# Patient Record
Sex: Female | Born: 1985 | Hispanic: Refuse to answer | Smoking: Current every day smoker
Health system: Southern US, Community
[De-identification: ages and names within clinical notes are randomized; demographics above are authoritative.]

## PROBLEM LIST (undated history)

## (undated) HISTORY — PX: WRIST SURGERY: SHX841

---

## 2010-04-29 ENCOUNTER — Emergency Department (HOSPITAL_COMMUNITY): Admission: EM | Admit: 2010-04-29 | Discharge: 2010-04-30 | Payer: Self-pay | Admitting: Emergency Medicine

## 2011-02-04 LAB — PREGNANCY, URINE: Preg Test, Ur: NEGATIVE

## 2016-04-16 ENCOUNTER — Emergency Department (HOSPITAL_COMMUNITY): Payer: No Typology Code available for payment source

## 2016-04-16 ENCOUNTER — Encounter (HOSPITAL_COMMUNITY): Payer: Self-pay | Admitting: Emergency Medicine

## 2016-04-16 ENCOUNTER — Emergency Department (HOSPITAL_COMMUNITY)
Admission: EM | Admit: 2016-04-16 | Discharge: 2016-04-17 | Disposition: A | Discharge status: Home / Self Care | Payer: No Typology Code available for payment source | Attending: Emergency Medicine | Admitting: Emergency Medicine

## 2016-04-16 DIAGNOSIS — K648 Other hemorrhoids: Secondary | ICD-10-CM | POA: Diagnosis not present

## 2016-04-16 DIAGNOSIS — R109 Unspecified abdominal pain: Secondary | ICD-10-CM

## 2016-04-16 DIAGNOSIS — F17200 Nicotine dependence, unspecified, uncomplicated: Secondary | ICD-10-CM | POA: Diagnosis not present

## 2016-04-16 DIAGNOSIS — R1013 Epigastric pain: Secondary | ICD-10-CM | POA: Diagnosis present

## 2016-04-16 LAB — CBC
HCT: 43.7 % (ref 36.0–46.0)
Hemoglobin: 14.9 g/dL (ref 12.0–15.0)
MCH: 30 pg (ref 26.0–34.0)
MCHC: 34.1 g/dL (ref 30.0–36.0)
MCV: 88.1 fL (ref 78.0–100.0)
PLATELETS: 228 10*3/uL (ref 150–400)
RBC: 4.96 MIL/uL (ref 3.87–5.11)
RDW: 13.3 % (ref 11.5–15.5)
WBC: 6.5 10*3/uL (ref 4.0–10.5)

## 2016-04-16 LAB — COMPREHENSIVE METABOLIC PANEL
ALBUMIN: 4.7 g/dL (ref 3.5–5.0)
ALT: 121 U/L — AB (ref 14–54)
AST: 69 U/L — AB (ref 15–41)
Alkaline Phosphatase: 52 U/L (ref 38–126)
Anion gap: 7 (ref 5–15)
BUN: 19 mg/dL (ref 6–20)
CHLORIDE: 100 mmol/L — AB (ref 101–111)
CO2: 30 mmol/L (ref 22–32)
Calcium: 9.6 mg/dL (ref 8.9–10.3)
Creatinine, Ser: 0.72 mg/dL (ref 0.44–1.00)
GFR calc Af Amer: >60 mL/min (ref >60)
GLUCOSE: 110 mg/dL — AB (ref 65–99)
POTASSIUM: 3.7 mmol/L (ref 3.5–5.1)
SODIUM: 137 mmol/L (ref 135–145)
Total Bilirubin: 0.3 mg/dL (ref 0.3–1.2)
Total Protein: 8.3 g/dL — ABNORMAL HIGH (ref 6.5–8.1)

## 2016-04-16 LAB — URINALYSIS, ROUTINE W REFLEX MICROSCOPIC
BILIRUBIN URINE: NEGATIVE
Glucose, UA: NEGATIVE mg/dL
HGB URINE DIPSTICK: NEGATIVE
Ketones, ur: NEGATIVE mg/dL
Leukocytes, UA: NEGATIVE
Nitrite: NEGATIVE
Specific Gravity, Urine: >1.03 — ABNORMAL HIGH (ref 1.005–1.030)
pH: 5.5 (ref 5.0–8.0)

## 2016-04-16 LAB — URINE MICROSCOPIC-ADD ON

## 2016-04-16 LAB — PREGNANCY, URINE: PREG TEST UR: NEGATIVE

## 2016-04-16 LAB — LIPASE, BLOOD: LIPASE: 23 U/L (ref 11–51)

## 2016-04-16 MED ORDER — MORPHINE SULFATE (PF) 4 MG/ML IV SOLN
4.0000 mg | Freq: Once | INTRAVENOUS | Status: AC
  Administered 2016-04-17: 4 mg via INTRAVENOUS
  Filled 2016-04-16: qty 1

## 2016-04-16 MED ORDER — SODIUM CHLORIDE 0.9 % IV BOLUS (SEPSIS)
1000.0000 mL | Freq: Once | INTRAVENOUS | Status: AC
  Administered 2016-04-17: 1000 mL via INTRAVENOUS

## 2016-04-16 MED ORDER — DIATRIZOATE MEGLUMINE & SODIUM 66-10 % PO SOLN
ORAL | Status: AC
  Filled 2016-04-16: qty 30

## 2016-04-16 MED ORDER — ONDANSETRON HCL 4 MG/2ML IJ SOLN
4.0000 mg | Freq: Once | INTRAMUSCULAR | Status: AC
Start: 2016-04-16 — End: 2016-04-17
  Administered 2016-04-17: 4 mg via INTRAVENOUS
  Filled 2016-04-16: qty 2
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
## 2016-04-17 ENCOUNTER — Emergency Department (HOSPITAL_COMMUNITY): Payer: No Typology Code available for payment source

## 2016-04-17 LAB — ACETAMINOPHEN LEVEL

## 2016-04-17 MED ORDER — HYDROCORTISONE 2.5 % RE CREA: TOPICAL_CREAM | RECTAL | Status: AC

## 2016-04-17 MED ORDER — IOPAMIDOL (ISOVUE-300) INJECTION 61%
100.0000 mL | Freq: Once | INTRAVENOUS | Status: AC | PRN
  Administered 2016-04-17: 100 mL via INTRAVENOUS

## 2016-04-17 NOTE — ED Notes (Signed)
Pt ambulated out of department without difficulty.  

## 2016-04-17 NOTE — Discharge Instructions (Signed)
Abdominal Pain, Adult Your CT scan is normal. Follow up with the surgeon to address possible muscle weakness of your abdominal wall. Also have your liver function rechecked by a primary doctor. Return to the ED if you develop new or worsening symptoms.  Many things can cause abdominal pain. Usually, abdominal pain is not caused by a disease and will improve without treatment. It can often be observed and treated at home. Your health care provider will do a physical exam and possibly order blood tests and X-rays to help determine the seriousness of your pain. However, in many cases, more time must pass before a clear cause of the pain can be found. Before that point, your health care provider may not know if you need more testing or further treatment. HOME CARE INSTRUCTIONS Monitor your abdominal pain for any changes. The following actions may help to alleviate any discomfort you are experiencing:  Only take over-the-counter or prescription medicines as directed by your health care provider.  Do not take laxatives unless directed to do so by your health care provider.  Try a clear liquid diet (broth, tea, or water) as directed by your health care provider. Slowly move to a bland diet as tolerated. SEEK MEDICAL CARE IF:  You have unexplained abdominal pain.  You have abdominal pain associated with nausea or diarrhea.  You have pain when you urinate or have a bowel movement.  You experience abdominal pain that wakes you in the night.  You have abdominal pain that is worsened or improved by eating food.  You have abdominal pain that is worsened with eating fatty foods.  You have a fever. SEEK IMMEDIATE MEDICAL CARE IF:  Your pain does not go away within 2 hours.  You keep throwing up (vomiting).  Your pain is felt only in portions of the abdomen, such as the right side or the left lower portion of the abdomen.  You pass bloody or black tarry stools. MAKE SURE YOU:  Understand these  instructions.  Will watch your condition.  Will get help right away if you are not doing well or get worse.   This information is not intended to replace advice given to you by your health care provider. Make sure you discuss any questions you have with your health care provider.   Document Released: 08/14/2005 Document Revised: 07/26/2015 Document Reviewed: 07/14/2013 Elsevier Interactive Patient Education [employer]2016 [employer]
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
